# Patient Record
Sex: Female | Born: 1960 | Race: Black or African American | Hispanic: No | Marital: Single | Smoking: Former smoker
Health system: Southern US, Community
[De-identification: ages and names within clinical notes are randomized; demographics above are authoritative.]

## PROBLEM LIST (undated history)

## (undated) DIAGNOSIS — B20 Human immunodeficiency virus [HIV] disease: Secondary | ICD-10-CM

## (undated) HISTORY — PX: ABDOMINAL HYSTERECTOMY: SHX81

---

## 2014-03-30 ENCOUNTER — Emergency Department (HOSPITAL_COMMUNITY)
Admission: EM | Admit: 2014-03-30 | Discharge: 2014-03-30 | Disposition: A | Discharge status: Home / Self Care | Payer: Medicaid - Out of State | Attending: Emergency Medicine | Admitting: Emergency Medicine

## 2014-03-30 ENCOUNTER — Emergency Department (HOSPITAL_COMMUNITY): Payer: Medicaid - Out of State

## 2014-03-30 ENCOUNTER — Encounter (HOSPITAL_COMMUNITY): Payer: Self-pay | Admitting: Emergency Medicine

## 2014-03-30 DIAGNOSIS — S99919A Unspecified injury of unspecified ankle, initial encounter: Secondary | ICD-10-CM | POA: Diagnosis present

## 2014-03-30 DIAGNOSIS — Y92838 Other recreation area as the place of occurrence of the external cause: Secondary | ICD-10-CM

## 2014-03-30 DIAGNOSIS — Y9389 Activity, other specified: Secondary | ICD-10-CM | POA: Diagnosis not present

## 2014-03-30 DIAGNOSIS — W2209XA Striking against other stationary object, initial encounter: Secondary | ICD-10-CM | POA: Diagnosis not present

## 2014-03-30 DIAGNOSIS — S93409A Sprain of unspecified ligament of unspecified ankle, initial encounter: Secondary | ICD-10-CM | POA: Insufficient documentation

## 2014-03-30 DIAGNOSIS — S8990XA Unspecified injury of unspecified lower leg, initial encounter: Secondary | ICD-10-CM | POA: Diagnosis present

## 2014-03-30 DIAGNOSIS — Y9239 Other specified sports and athletic area as the place of occurrence of the external cause: Secondary | ICD-10-CM | POA: Insufficient documentation

## 2014-03-30 DIAGNOSIS — S93402A Sprain of unspecified ligament of left ankle, initial encounter: Secondary | ICD-10-CM

## 2014-03-30 HISTORY — DX: Human immunodeficiency virus (HIV) disease: B20

## 2014-03-30 MED ORDER — HYDROCODONE-ACETAMINOPHEN 5-325 MG PO TABS: 1.0000 | ORAL_TABLET | ORAL | Status: AC | PRN

## 2014-03-30 MED ORDER — OXYCODONE-ACETAMINOPHEN 5-325 MG PO TABS
2.0000 | ORAL_TABLET | Freq: Once | ORAL | Status: AC
  Administered 2014-03-30: 2 via ORAL
  Filled 2014-03-30: qty 2

## 2014-03-30 NOTE — ED Notes (Signed)
Patient presents stating she hit her left outer ankle on the bleachers.  +swelling moves all toes well

## 2014-03-30 NOTE — ED Provider Notes (Signed)
CSN: [id number]     Arrival date & time 03/30/14  0009 History   First MD Initiated Contact with Patient 03/30/14 0010     Chief Complaint  Patient presents with  . Foot Injury     (Consider location/radiation/quality/duration/timing/severity/associated sxs/prior Treatment) HPI Comments: Patient is a 53 year old female who presents with left ankle pain that started this afternoon after she accidentally hit it on bleachers at a track meet. The pain started suddenly and progressively worsened. The pain is throbbing and severe without radiation. Patient reports there was a doctor at the meet who performed an ultrasound on her and told her she had a fracture. He applied a splint. Patient did not come to the ED right away because she thought the pain would improve but it did not. Palpation and weight bearing activity makes the pain worse. No alleviating factors. No other injury.    No past medical history on file. No past surgical history on file. No family history on file. History  Substance Use Topics  . Smoking status: Not on file  . Smokeless tobacco: Not on file  . Alcohol Use: Not on file   OB History   No data available     Review of Systems  Constitutional: Negative for fever, chills and fatigue.  HENT: Negative for trouble swallowing.   Eyes: Negative for visual disturbance.  Respiratory: Negative for shortness of breath.   Cardiovascular: Negative for chest pain and palpitations.  Gastrointestinal: Negative for nausea, vomiting, abdominal pain and diarrhea.  Genitourinary: Negative for dysuria and difficulty urinating.  Musculoskeletal: Positive for arthralgias and joint swelling. Negative for neck pain.  Skin: Negative for color change.  Neurological: Negative for dizziness and weakness.  Psychiatric/Behavioral: Negative for dysphoric mood.      Allergies  Review of patient's allergies indicates not on file.  Home Medications   Prior to Admission medications    Not on File   Ht 5\' 7"  (1.702 m)  Wt 206 lb (93.441 kg)  BMI 32.26 kg/m2 Physical Exam  Nursing note and vitals reviewed. Constitutional: She is oriented to person, place, and time. She appears well-developed and well-nourished. No distress.  HENT:  Head: Normocephalic and atraumatic.  Eyes: Conjunctivae are normal.  Neck: Normal range of motion.  Cardiovascular: Normal rate, regular rhythm and intact distal pulses.  Exam reveals no gallop and no friction rub.   No murmur heard. Pulmonary/Chest: Effort normal and breath sounds normal. She has no wheezes. She has no rales. She exhibits no tenderness.  Musculoskeletal:  Limited ROM of left ankle to to pain. Lateral left ankle swelling and tenderness to palpation. No obvious deformity.   Neurological: She is alert and oriented to person, place, and time. Coordination normal.  Sensation intact of left foot. Speech is goal-oriented. Moves limbs without ataxia.   Skin: Skin is warm and dry.  Psychiatric: She has a normal mood and affect. Her behavior is normal.    ED Course  Procedures (including critical care time) Labs Review Labs Reviewed - No data to display  SPLINT APPLICATION Date/Time: 1:37 AM Authorized by: Emilia BeckKaitlyn Kindra Bickham Consent: Verbal consent obtained. Risks and benefits: risks, benefits and alternatives were discussed Consent given by: patient Splint applied by: nurse Location details: left ankle Splint type: ASO brace Supplies used: ASO brace Post-procedure: The splinted body part was neurovascularly unchanged following the procedure. Patient tolerance: Patient tolerated the procedure well with no immediate complications.     Imaging Review Dg Ankle Complete Left  03/30/2014  CLINICAL DATA:  Left ankle pain, status post injury.  EXAM: LEFT ANKLE COMPLETE - 3+ VIEW  COMPARISON:  None.  FINDINGS: There is no evidence of fracture or dislocation. The ankle mortise is intact; the interosseous space is within  normal limits. No talar tilt or subluxation is seen.  The joint spaces are preserved. Lateral soft tissue swelling is noted.  IMPRESSION: No evidence of fracture or dislocation.   Electronically Signed   By: Roanna RaiderJeffery  Chang M.D.   On: 03/30/2014 01:20     EKG Interpretation None      MDM   Final diagnoses:  Left ankle sprain, initial encounter    12:20 AM Patient given Percocet for pain. Left ankle xray pending. No neurovascular compromise.   1:36 AM Patient's xray shows no acute changes. Patient likely has an ankle sprain/contusion. Patient is upset with my diagnosis. Patient will have ASO brace, crutches and Vicodin prescription. No further evaluation needed at this time.     Emilia BeckKaitlyn Alessio Bogan, [place] 03/30/14 [phone]

## 2014-03-30 NOTE — ED Notes (Signed)
Patient returned from xray.

## 2014-03-30 NOTE — ED Notes (Signed)
Patient transported to X-ray 

## 2014-03-30 NOTE — ED Notes (Signed)
Radiology notified of xray.

## 2014-03-30 NOTE — ED Provider Notes (Signed)
Medical screening examination/treatment/procedure(s) were performed by non-physician practitioner and as supervising physician I was immediately available for consultation/collaboration.   EKG Interpretation None       Olivia Mackielga M Diane Hanel, MD 03/30/14 [phone]

## 2014-03-30 NOTE — Discharge Instructions (Signed)
Rest, ice and elevate your left ankle. Wear your brace as needed for support. Use crutches for comfort. Bear weight on your ankle as tolerated. Refer to attached documents for more information.

## 2015-06-23 IMAGING — CR DG ANKLE COMPLETE 3+V*L*
3 series · 3 of 3 positions shown · non-contrast
Comparison: None.

CLINICAL DATA: Left ankle pain, status post injury.

EXAM:
LEFT ANKLE COMPLETE - 3+ VIEW

[x ankle ap left]
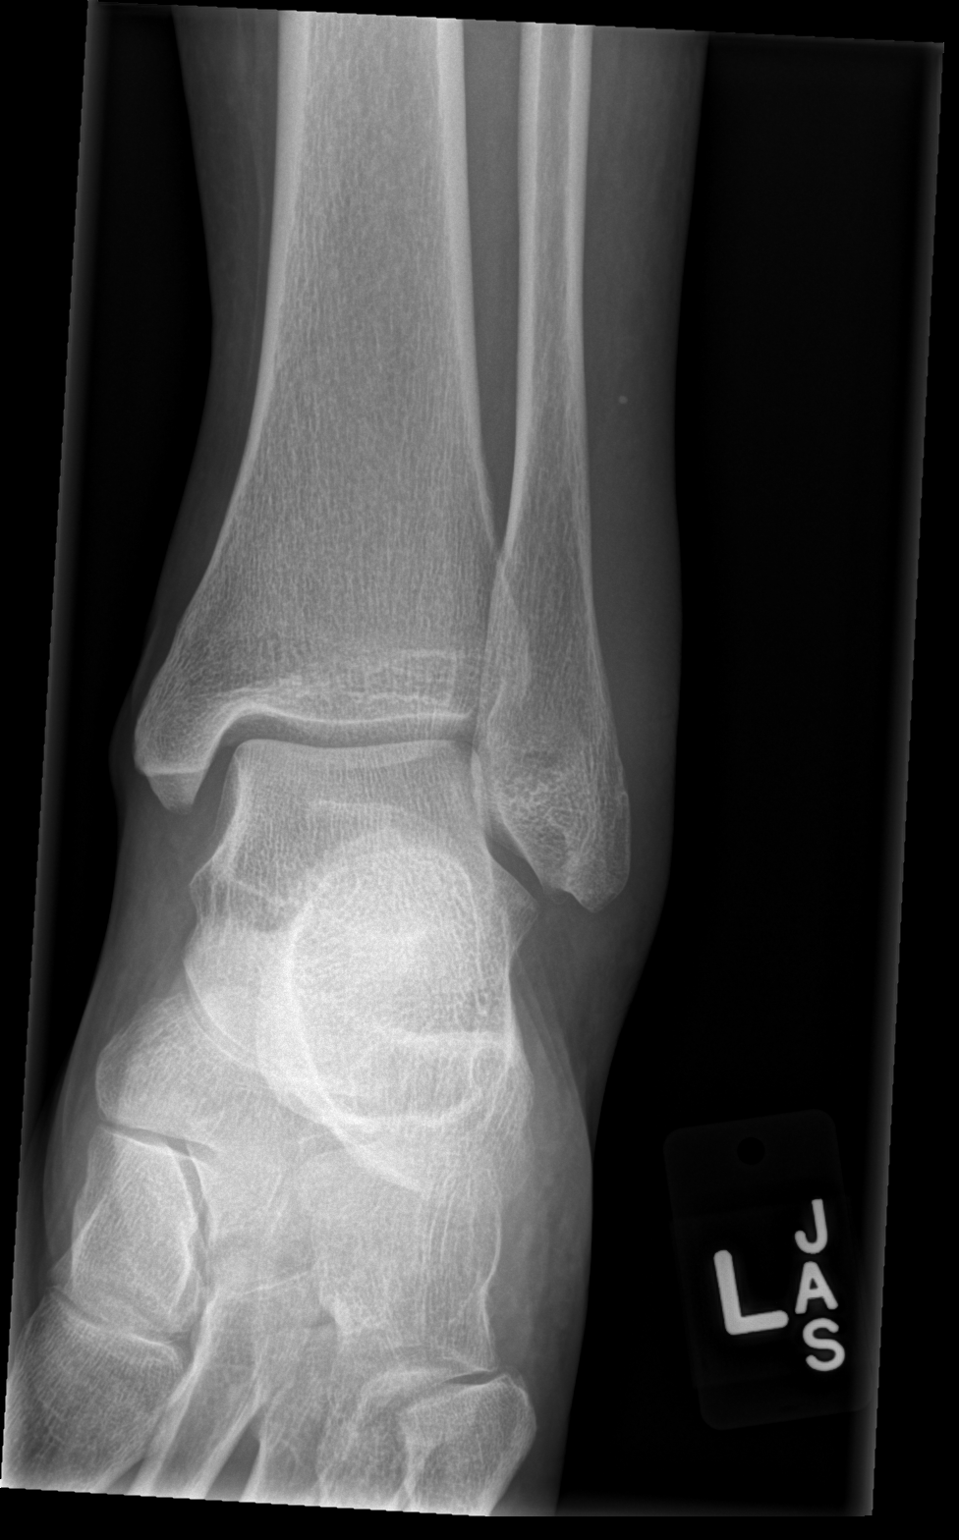

[x ankle obl left]
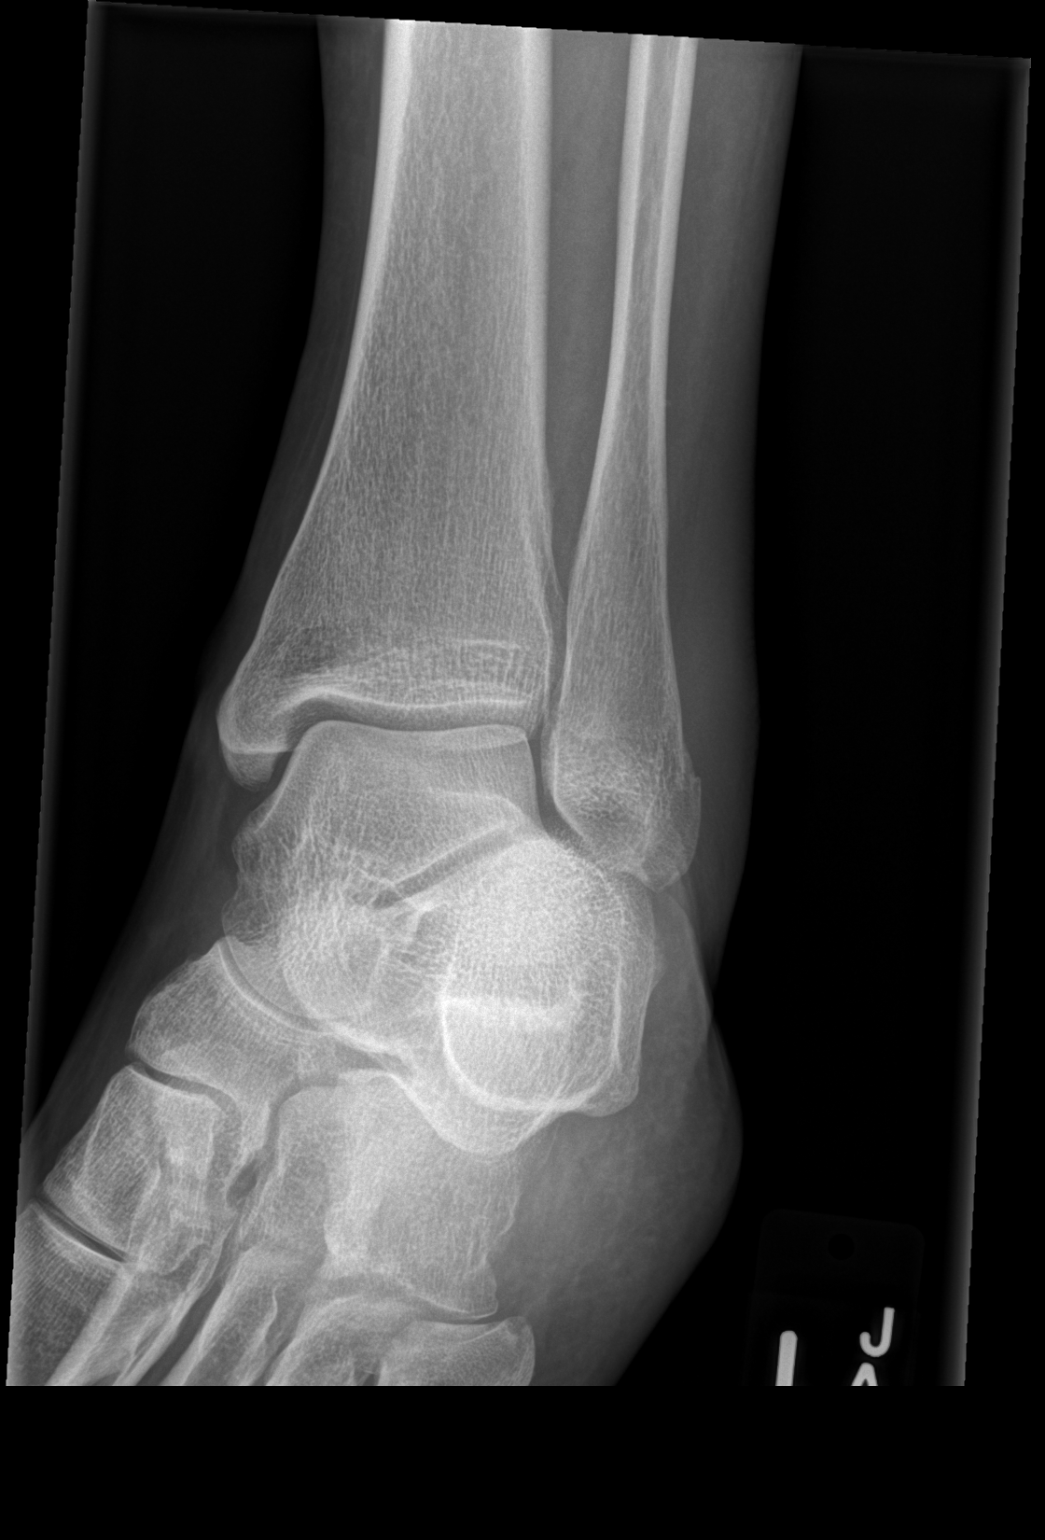

[x ankle lat left]
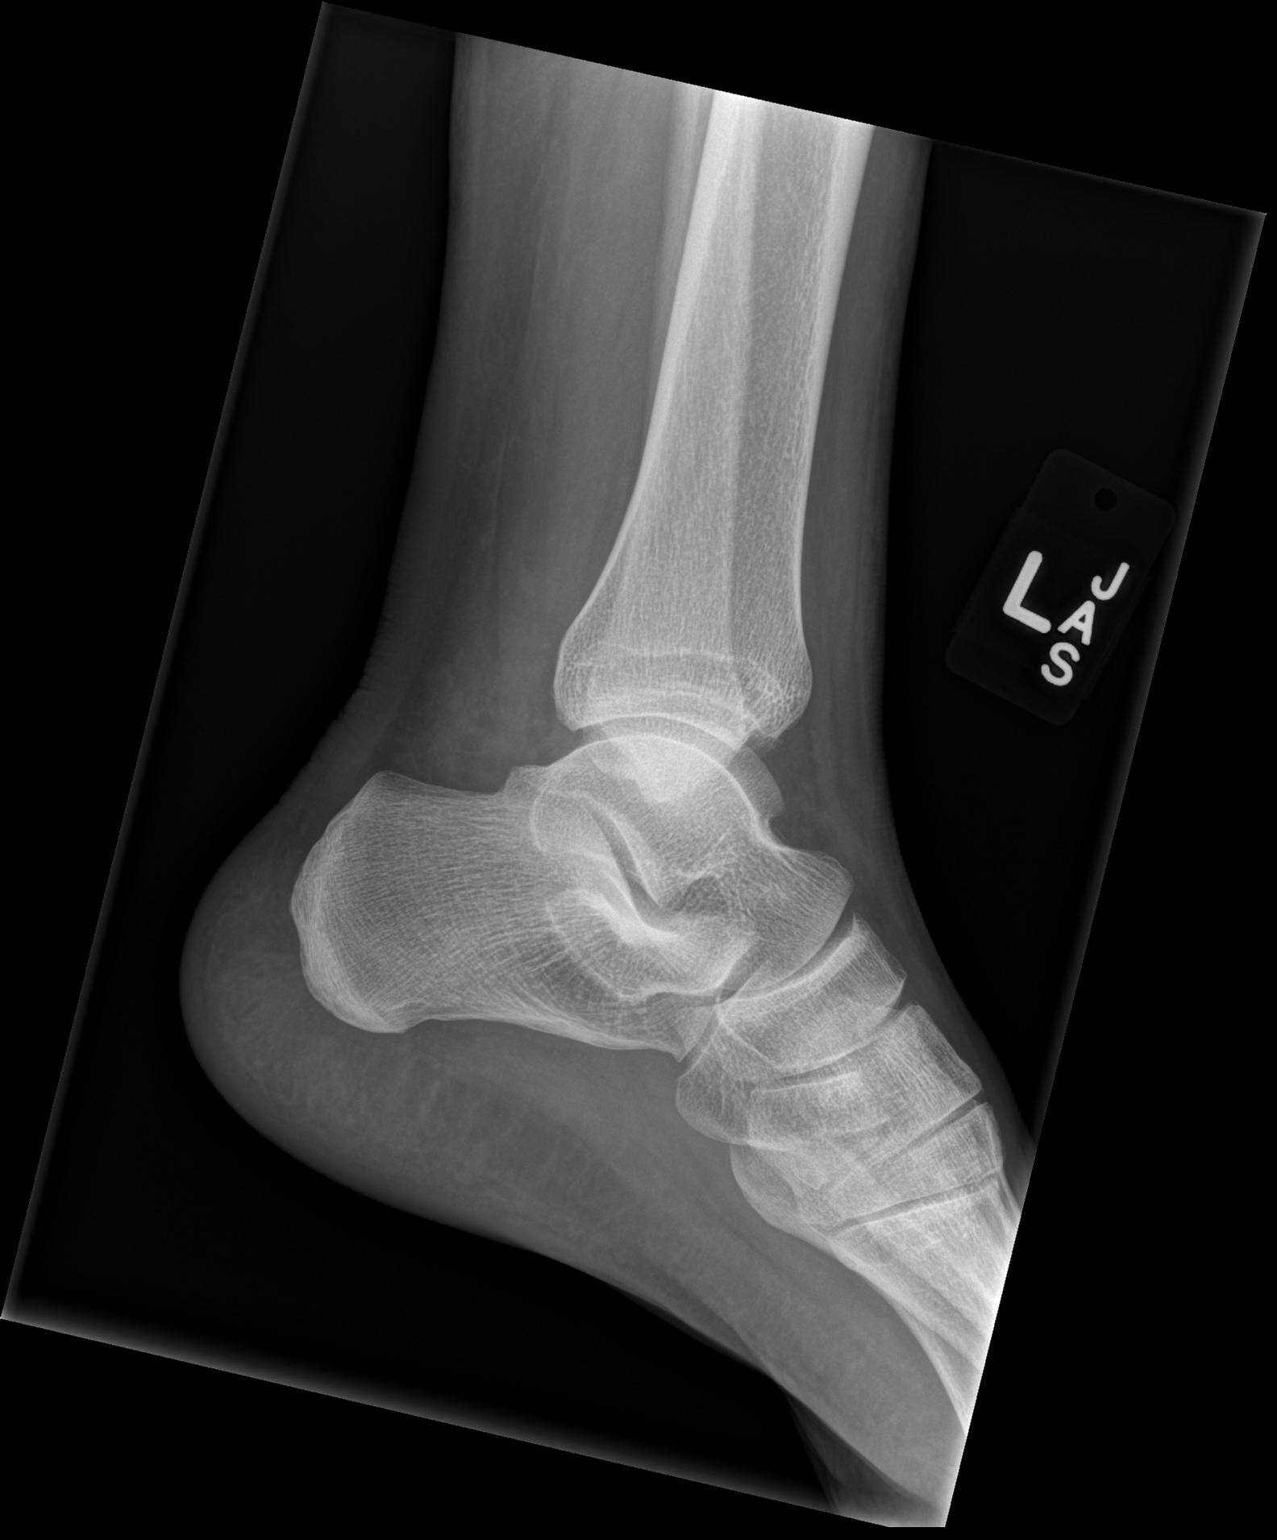

[3 of 3 positions shown; findings below may reference images not displayed]

FINDINGS: There is no evidence of fracture or dislocation. The ankle mortise
is intact; the interosseous space is within normal limits. No talar
tilt or subluxation is seen.

The joint spaces are preserved. Lateral soft tissue swelling is
noted.
IMPRESSION: No evidence of fracture or dislocation.
